# Patient Record
Sex: Male | Born: 1985 | Race: Black or African American | Hispanic: No | Marital: Single | Smoking: Former smoker
Health system: Southern US, Community
[De-identification: ages and names within clinical notes are randomized; demographics above are authoritative.]

## PROBLEM LIST (undated history)

## (undated) DIAGNOSIS — K219 Gastro-esophageal reflux disease without esophagitis: Secondary | ICD-10-CM

---

## 2005-05-31 ENCOUNTER — Emergency Department (HOSPITAL_COMMUNITY): Admission: EM | Admit: 2005-05-31 | Discharge: 2005-05-31 | Payer: Self-pay | Admitting: Emergency Medicine

## 2005-06-21 ENCOUNTER — Emergency Department (HOSPITAL_COMMUNITY): Admission: EM | Admit: 2005-06-21 | Discharge: 2005-06-21 | Payer: Self-pay | Admitting: Emergency Medicine

## 2006-06-02 ENCOUNTER — Emergency Department (HOSPITAL_COMMUNITY): Admission: EM | Admit: 2006-06-02 | Discharge: 2006-06-02 | Payer: Self-pay | Admitting: Emergency Medicine

## 2008-01-30 ENCOUNTER — Emergency Department (HOSPITAL_COMMUNITY): Admission: EM | Admit: 2008-01-30 | Discharge: 2008-01-30 | Payer: Self-pay | Admitting: Emergency Medicine

## 2008-03-01 ENCOUNTER — Emergency Department (HOSPITAL_COMMUNITY): Admission: EM | Admit: 2008-03-01 | Discharge: 2008-03-01 | Payer: Self-pay | Admitting: Emergency Medicine

## 2008-03-15 ENCOUNTER — Emergency Department (HOSPITAL_COMMUNITY): Admission: EM | Admit: 2008-03-15 | Discharge: 2008-03-15 | Payer: Self-pay | Admitting: Emergency Medicine

## 2008-07-14 ENCOUNTER — Emergency Department (HOSPITAL_COMMUNITY): Admission: EM | Admit: 2008-07-14 | Discharge: 2008-07-14 | Payer: Self-pay | Admitting: Emergency Medicine

## 2008-10-02 ENCOUNTER — Emergency Department (HOSPITAL_COMMUNITY): Admission: EM | Admit: 2008-10-02 | Discharge: 2008-10-02 | Payer: Self-pay | Admitting: Emergency Medicine

## 2010-11-01 LAB — DIFFERENTIAL
Basophils Absolute: 0 10*3/uL (ref 0.0–0.1)
Basophils Relative: 0 % (ref 0–1)
Eosinophils Relative: 2 % (ref 0–5)
Monocytes Absolute: 0.4 10*3/uL (ref 0.1–1.0)
Neutrophils Relative %: 55 % (ref 43–77)

## 2010-11-01 LAB — CBC
Hemoglobin: 14.2 g/dL (ref 13.0–17.0)
Platelets: 308 10*3/uL (ref 150–400)
RBC: 5.03 MIL/uL (ref 4.22–5.81)
RDW: 12.1 % (ref 11.5–15.5)

## 2010-11-01 LAB — COMPREHENSIVE METABOLIC PANEL
Albumin: 4.5 g/dL (ref 3.5–5.2)
Alkaline Phosphatase: 51 U/L (ref 39–117)
BUN: 9 mg/dL (ref 6–23)
Chloride: 101 mEq/L (ref 96–112)
Creatinine, Ser: 1.36 mg/dL (ref 0.4–1.5)
GFR calc non Af Amer: >60 mL/min (ref >60)
Sodium: 136 mEq/L (ref 135–145)
Total Bilirubin: 1.1 mg/dL (ref 0.3–1.2)
Total Protein: 7.6 g/dL (ref 6.0–8.3)

## 2010-11-01 LAB — ETHANOL: Alcohol, Ethyl (B): <5 mg/dL (ref 0–10)

## 2010-11-01 LAB — LIPASE, BLOOD: Lipase: 22 U/L (ref 11–59)

## 2011-04-19 LAB — GC/CHLAMYDIA PROBE AMP, GENITAL
Chlamydia, DNA Probe: NEGATIVE
GC Probe Amp, Genital: NEGATIVE

## 2011-09-01 ENCOUNTER — Encounter (HOSPITAL_COMMUNITY): Payer: Self-pay | Admitting: Adult Health

## 2011-09-01 ENCOUNTER — Emergency Department (HOSPITAL_COMMUNITY)
Admission: EM | Admit: 2011-09-01 | Discharge: 2011-09-01 | Disposition: A | Discharge status: Home / Self Care | Payer: Self-pay | Attending: Emergency Medicine | Admitting: Emergency Medicine

## 2011-09-01 DIAGNOSIS — N342 Other urethritis: Secondary | ICD-10-CM | POA: Insufficient documentation

## 2011-09-01 DIAGNOSIS — R369 Urethral discharge, unspecified: Secondary | ICD-10-CM | POA: Insufficient documentation

## 2011-09-01 DIAGNOSIS — R109 Unspecified abdominal pain: Secondary | ICD-10-CM | POA: Insufficient documentation

## 2011-09-01 DIAGNOSIS — R10819 Abdominal tenderness, unspecified site: Secondary | ICD-10-CM | POA: Insufficient documentation

## 2011-09-01 HISTORY — DX: Gastro-esophageal reflux disease without esophagitis: K21.9

## 2011-09-01 LAB — URINALYSIS, ROUTINE W REFLEX MICROSCOPIC
Bilirubin Urine: NEGATIVE
Glucose, UA: NEGATIVE mg/dL
Hgb urine dipstick: NEGATIVE
Specific Gravity, Urine: 1.015 (ref 1.005–1.030)

## 2011-09-01 LAB — RPR: RPR Ser Ql: NONREACTIVE

## 2011-09-01 MED ORDER — CEFTRIAXONE SODIUM 250 MG IJ SOLR
250.0000 mg | Freq: Once | INTRAMUSCULAR | Status: AC
  Administered 2011-09-01: 250 mg via INTRAMUSCULAR
  Filled 2011-09-01: qty 250

## 2011-09-01 MED ORDER — AZITHROMYCIN 250 MG PO TABS
1000.0000 mg | ORAL_TABLET | Freq: Once | ORAL | Status: AC
  Administered 2011-09-01: 1000 mg via ORAL
  Filled 2011-09-01: qty 4

## 2011-09-01 MED ORDER — LIDOCAINE HCL 2 % IJ SOLN
INTRAMUSCULAR | Status: AC
  Administered 2011-09-01: 11:00:00
  Filled 2011-09-01: qty 1

## 2011-09-01 NOTE — ED Provider Notes (Signed)
Medical screening examination/treatment/procedure(s) were performed by non-physician practitioner and as supervising physician I was immediately available for consultation/collaboration.  Doug Sou, MD 09/01/11 [phone]

## 2011-09-01 NOTE — ED Provider Notes (Signed)
History     CSN: [id number]  Arrival date & time 09/01/11  0918   First MD Initiated Contact with Patient 09/01/11 [phone]      Chief Complaint  Patient presents with  . Abdominal Pain    (Consider location/radiation/quality/duration/timing/severity/associated sxs/prior treatment) HPI Comments: Patient reports 2-3 days of LLQ pain that is aching and intermittent, with associated while penile discharge.  Denies fever, N/V, scrotal or testicular pain or tenderness, dysuria, urinary frequency or urgency, change in bowel habits.  Last BM was today and was normal.  Patient was recently released from jail and has since had sexual intercourse with a new partner.    Patient is a 26 y.o. male presenting with abdominal pain. The history is provided by the patient.  Abdominal Pain The primary symptoms of the illness include abdominal pain. The primary symptoms of the illness do not include fever, nausea, vomiting, diarrhea or dysuria.  Symptoms associated with the illness do not include constipation, urgency, hematuria or frequency.    Past Medical History  Diagnosis Date  . GERD (gastroesophageal reflux disease)     History reviewed. No pertinent past surgical history.  History reviewed. No pertinent family history.  History  Substance Use Topics  . Smoking status: Current Everyday Smoker  . Smokeless tobacco: Not on file  . Alcohol Use: Yes      Review of Systems  Constitutional: Negative for fever.  Gastrointestinal: Positive for abdominal pain. Negative for nausea, vomiting, diarrhea, constipation and rectal pain.  Genitourinary: Positive for discharge. Negative for dysuria, urgency, frequency, hematuria, penile swelling, scrotal swelling, penile pain and testicular pain.  All other systems reviewed and are negative.    Allergies  Review of patient's allergies indicates no known allergies.  Home Medications   Current Outpatient Rx  Name Route Sig Dispense Refill  . ADULT  MULTIVITAMIN W/MINERALS CH Oral Take 1 tablet by mouth daily.    Marland Kitchen PSEUDOEPH-DOXYLAMINE-DM-APAP 60-7.12-18-998 MG/30ML PO LIQD Oral Take 30 mLs by mouth every 6 (six) hours as needed. For symptom relief      BP 118/57  Pulse 55  Temp 98.4 F (36.9 C)  Resp 16  SpO2 100%  Physical Exam  Constitutional: He is oriented to person, place, and time. He appears well-developed and well-nourished.  HENT:  Head: Normocephalic and atraumatic.  Neck: Neck supple.  Cardiovascular: Normal rate, regular rhythm and normal heart sounds.   Pulmonary/Chest: Breath sounds normal. No respiratory distress. He has no wheezes. He has no rales. He exhibits no tenderness.  Abdominal: Soft. Bowel sounds are normal. He exhibits no distension and no mass. There is tenderness in the suprapubic area. There is no rebound and no guarding.    Genitourinary: Testes normal. Right testis shows no mass, no swelling and no tenderness. Right testis is descended. Left testis shows no mass, no swelling and no tenderness. Left testis is descended. Circumcised. Discharge found.       White urethral discharge.  No lesions noted.   Neurological: He is alert and oriented to person, place, and time.    ED Course  Procedures (including critical care time)  Labs Reviewed  URINALYSIS, ROUTINE W REFLEX MICROSCOPIC - Abnormal; Notable for the following:    Leukocytes, UA MODERATE (*)    All other components within normal limits  URINE MICROSCOPIC-ADD ON  GC/CHLAMYDIA PROBE AMP, GENITAL  RPR   No results found.   1. Urethritis       MDM  Well-appearing, afebrile patient with left suprapubic tenderness  and white penile discharge. Patient is not having fever, vomiting, dysuria, urinary frequency or urgency. Patient has a new sexual partner. This is likely consistent with sexual transmitted infection. GC chlamydia and RPR sent to the lab. Patient gets his normal STD and HIV testing at the health Department and last checked in  2012. Patient treated for GC chlamydia in the emergency department and discharged home with health department followup.  Patient verbalizes understanding and agrees with plan. Rise Patience, [place] 09/01/11 1441

## 2011-09-01 NOTE — ED Notes (Signed)
Presents with abdominal for 3 days. Pain is located in lower left quad associated with nausea ans vomitting, denies diarrhea. C/o of painful discharge from penis.

## 2011-09-03 LAB — GC/CHLAMYDIA PROBE AMP, GENITAL
Chlamydia, DNA Probe: NEGATIVE
GC Probe Amp, Genital: POSITIVE — AB

## 2011-09-04 NOTE — ED Notes (Signed)
Treated per protocol MD; Sensitive to same 

## 2011-09-04 NOTE — ED Notes (Signed)
Pt notified of lab result and informed to not have sex for atleast two weeks

## 2012-01-18 ENCOUNTER — Encounter (HOSPITAL_COMMUNITY): Payer: Self-pay | Admitting: *Deleted

## 2012-01-18 ENCOUNTER — Emergency Department (HOSPITAL_COMMUNITY)
Admission: EM | Admit: 2012-01-18 | Discharge: 2012-01-18 | Disposition: A | Discharge status: Home / Self Care | Payer: Self-pay | Attending: Emergency Medicine | Admitting: Emergency Medicine

## 2012-01-18 DIAGNOSIS — R109 Unspecified abdominal pain: Secondary | ICD-10-CM | POA: Insufficient documentation

## 2012-01-18 DIAGNOSIS — F17200 Nicotine dependence, unspecified, uncomplicated: Secondary | ICD-10-CM | POA: Insufficient documentation

## 2012-01-18 DIAGNOSIS — K219 Gastro-esophageal reflux disease without esophagitis: Secondary | ICD-10-CM | POA: Insufficient documentation

## 2012-01-18 DIAGNOSIS — Z202 Contact with and (suspected) exposure to infections with a predominantly sexual mode of transmission: Secondary | ICD-10-CM | POA: Insufficient documentation

## 2012-01-18 NOTE — Discharge Instructions (Signed)
Sexually Transmitted Disease Sexually transmitted disease (STD) refers to any infection that is passed from person to person during sexual activity. This may happen by way of saliva, semen, blood, vaginal mucus, or urine. Common STDs include:  Gonorrhea.   Chlamydia.   Syphilis.   HIV/AIDS.   Genital herpes.   Hepatitis B and C.   Trichomonas.   Human papillomavirus (HPV).   Pubic lice.  CAUSES  An STD may be spread by bacteria, virus, or parasite. A person can get an STD by:  Sexual intercourse with an infected person.   Sharing sex toys with an infected person.   Sharing needles with an infected person.   Having intimate contact with the genitals, mouth, or rectal areas of an infected person.  SYMPTOMS  Some people may not have any symptoms, but they can still pass the infection to others. Different STDs have different symptoms. Symptoms include:  Painful or bloody urination.   Pain in the pelvis, abdomen, vagina, anus, throat, or eyes.   Skin rash, itching, irritation, growths, or sores (lesions). These usually occur in the genital or anal area.   Abnormal vaginal discharge.   Penile discharge in men.   Soft, flesh-colored skin growths in the genital or anal area.   Fever.   Pain or bleeding during sexual intercourse.   Swollen glands in the groin area.   Yellow skin and eyes (jaundice). This is seen with hepatitis.  DIAGNOSIS  To make a diagnosis, your caregiver may:  Take a medical history.   Perform a physical exam.   Take a specimen (culture) to be examined.   Examine a sample of discharge under a microscope.   Perform blood tests.   Perform a Pap test, if this applies.   Perform a colposcopy.   Perform a laparoscopy.  TREATMENT   Chlamydia, gonorrhea, trichomonas, and syphilis can be cured with antibiotic medicine.   Genital herpes, hepatitis, and HIV can be treated, but not cured, with prescribed medicines. The medicines will lessen  the symptoms.   Genital warts from HPV can be treated with medicine or by freezing, burning (electrocautery), or surgery. Warts may come back.   HPV is a virus and cannot be cured with medicine or surgery.However, abnormal areas may be followed very closely by your caregiver and may be removed from the cervix, vagina, or vulva through office procedures or surgery.  If your diagnosis is confirmed, your recent sexual partners need treatment. This is true even if they are symptom-free or have a negative culture or evaluation. They should not have sex until their caregiver says it is okay. HOME CARE INSTRUCTIONS  All sexual partners should be informed, tested, and treated for all STDs.   Take your antibiotics as directed. Finish them even if you start to feel better.   Only take over-the-counter or prescription medicines for pain, discomfort, or fever as directed by your caregiver.   Rest.   Eat a balanced diet and drink enough fluids to keep your urine clear or pale yellow.   Do not have sex until treatment is completed and you have followed up with your caregiver. STDs should be checked after treatment.   Keep all follow-up appointments, Pap tests, and blood tests as directed by your caregiver.   Only use latex condoms and water-soluble lubricants during sexual activity. Do not use petroleum jelly or oils.   Avoid alcohol and illegal drugs.   Get vaccinated for HPV and hepatitis. If you have not received these vaccines   in the past, talk to your caregiver about whether one or both might be right for you.   Avoid risky sex practices that can break the skin.  The only way to avoid getting an STD is to avoid all sexual activity.Latex condoms and dental dams (for oral sex) will help lessen the risk of getting an STD, but will not completely eliminate the risk. SEEK MEDICAL CARE IF:   You have a fever.   You have any new or worsening symptoms.  Document Released: 09/28/2002 Document  Revised: 06/27/2011 Document Reviewed: 10/05/2010 Exit[employer] Patient Information 2012 [employer]. 

## 2012-01-18 NOTE — ED Notes (Signed)
Pt came out of room sts he needs to go home stat and that he needs to be at home by 1700, pt was up for discharge but was in a big hurry that he didn't want to spend the time to sign the E-Signature, but did grab the Discharge instruction. Pt didn't even wait for blood drawn for his STD testing. Informed that the lab will call him for positive gonorrhea and chlamydia

## 2012-01-18 NOTE — ED Notes (Signed)
Pt from home with reports of intermittent, lower abdominal pain as well as itching in the pubic hair area but denies penial pain, itching or discharge. Pt also denies N/V/D, recent surgery or injury to abdominal area.

## 2012-01-18 NOTE — ED Provider Notes (Signed)
History     CSN: [id number]  Arrival date & time 01/18/12  1442   First MD Initiated Contact with Patient 01/18/12 1559      Chief Complaint  Patient presents with  . Abdominal Pain  . Pruritis    pubic area    HPI The patient presents to the emergency room with concerns about getting checked for an STD. Patient states he is low but embarrassed when he was in triage. He's not really having any abdominal cramping. He also has not really noticed any itching. Patient states he has had 2 sexual partners recently. He got a call from one of him today telling him that she thinks she has an STD. She has been having vaginal discharge but has not seen anyone yet. He denies any dysuria or penile discharge. He states he's not really having any symptoms but just want to get checked out. Past Medical History  Diagnosis Date  . GERD (gastroesophageal reflux disease)     History reviewed. No pertinent past surgical history.  History reviewed. No pertinent family history.  History  Substance Use Topics  . Smoking status: Current Everyday Smoker -- 0.5 packs/day    Types: Cigarettes  . Smokeless tobacco: Never Used  . Alcohol Use: Yes     occ      Review of Systems  Constitutional: Negative for fever.  Gastrointestinal: Negative for abdominal pain and abdominal distention.  Genitourinary: Negative for dysuria.    Allergies  Review of patient's allergies indicates no known allergies.  Home Medications   Current Outpatient Rx  Name Route Sig Dispense Refill  . ADULT MULTIVITAMIN W/MINERALS CH Oral Take 1 tablet by mouth daily.      BP 117/67  Pulse 66  Temp 99.2 F (37.3 C) (Oral)  Resp 16  Ht 5\' 8"  (1.727 m)  Wt 180 lb (81.647 kg)  BMI 27.37 kg/m2  SpO2 100%  Physical Exam  Nursing note and vitals reviewed. Constitutional: He appears well-developed and well-nourished. No distress.  HENT:  Head: Normocephalic and atraumatic.  Right Ear: External ear normal.  Left Ear:  External ear normal.  Eyes: Conjunctivae are normal. Right eye exhibits no discharge. Left eye exhibits no discharge. No scleral icterus.  Neck: Neck supple. No tracheal deviation present.  Cardiovascular: Normal rate, regular rhythm and intact distal pulses.   Pulmonary/Chest: Effort normal and breath sounds normal. No stridor. No respiratory distress. He has no wheezes. He has no rales.  Abdominal: Soft. Bowel sounds are normal. He exhibits no distension. There is no tenderness. There is no rebound and no guarding.  Genitourinary: Penis normal.  Musculoskeletal: He exhibits no edema and no tenderness.  Neurological: He is alert. He has normal strength. No sensory deficit. Cranial nerve deficit:  no gross defecits noted. He exhibits normal muscle tone. He displays no seizure activity. Coordination normal.  Skin: Skin is warm and dry. No rash noted.  Psychiatric: He has a normal mood and affect.    ED Course  Procedures (including critical care time)   Labs Reviewed  GC/CHLAMYDIA PROBE AMP, GENITAL  RPR  HIV ANTIBODY (ROUTINE TESTING)   No results found.   No diagnosis found.    MDM  STD exposure panels been ordered per the patient's request. I offered HIV screening as well and he did request that that be done in addition to the [facility] Chlamydia probe and the RPR test.        Celene Kras, MD 01/18/12 [phone]

## 2012-01-20 LAB — GC/CHLAMYDIA PROBE AMP, GENITAL
Chlamydia, DNA Probe: NEGATIVE
GC Probe Amp, Genital: NEGATIVE

## 2013-04-18 ENCOUNTER — Emergency Department (HOSPITAL_COMMUNITY)
Admission: EM | Admit: 2013-04-18 | Discharge: 2013-04-18 | Disposition: A | Discharge status: Home / Self Care | Payer: Self-pay | Attending: Emergency Medicine | Admitting: Emergency Medicine

## 2013-04-18 ENCOUNTER — Emergency Department: Payer: Self-pay | Admitting: Internal Medicine

## 2013-04-18 ENCOUNTER — Encounter (HOSPITAL_COMMUNITY): Payer: Self-pay | Admitting: *Deleted

## 2013-04-18 DIAGNOSIS — Z8719 Personal history of other diseases of the digestive system: Secondary | ICD-10-CM | POA: Insufficient documentation

## 2013-04-18 DIAGNOSIS — B356 Tinea cruris: Secondary | ICD-10-CM | POA: Insufficient documentation

## 2013-04-18 DIAGNOSIS — F17200 Nicotine dependence, unspecified, uncomplicated: Secondary | ICD-10-CM | POA: Insufficient documentation

## 2013-04-18 MED ORDER — TERBINAFINE HCL 1 % EX CREA: TOPICAL_CREAM | Freq: Two times a day (BID) | CUTANEOUS | Status: DC

## 2013-04-18 NOTE — ED Provider Notes (Signed)
Medical screening examination/treatment/procedure(s) were performed by non-physician practitioner and as supervising physician I was immediately available for consultation/collaboration.   Dagmar Hait, MD 04/18/13 [phone]

## 2013-04-18 NOTE — ED Provider Notes (Signed)
CSN: [id number]     Arrival date & time 04/18/13  1509 History  This chart was scribed for non-physician practitioner working with Dagmar Hait, MD by Danella Maiers, ED Scribe. This patient was seen in room TR08C/TR08C and the patient's care was started at 4:05 PM.   Chief Complaint  Patient presents with  . Rash   Patient is a 27 y.o. male presenting with rash. The history is provided by the patient. No language interpreter was used.  Rash  HPI Comments: Kyle Lang is a 27 y.o. male who presents to the Emergency Department complaining of constant itchy rash to the suprapubic region in pubic hair area onset one week ago. He denies rash to shaft or penile discharge. He works out frequently. He denies having any pain or noticing any lesions to the area. He has not had any systemic symptoms of fevers, nausea, vomiting, diarrhea, weakness. He denies urinary problems, penile discharge. He denies any pain to the area.  Past Medical History  Diagnosis Date  . GERD (gastroesophageal reflux disease)    History reviewed. No pertinent past surgical history. History reviewed. No pertinent family history. History  Substance Use Topics  . Smoking status: Current Every Day Smoker -- 0.50 packs/day    Types: Cigarettes  . Smokeless tobacco: Never Used  . Alcohol Use: Yes     Comment: occ    Review of Systems  Genitourinary: Negative for dysuria, discharge and difficulty urinating.  Skin: Positive for rash.  All other systems reviewed and are negative.    Allergies  Review of patient's allergies indicates no known allergies.  Home Medications   Current Outpatient Rx  Name  Route  Sig  Dispense  Refill  . terbinafine (LAMISIL AT JOCK ITCH) 1 % cream   Topical   Apply topically 2 (two) times daily. For 3- 4 weeks   30 g   0    BP 126/69  Pulse 64  Temp(Src) 99 F (37.2 C) (Oral)  Resp 98  SpO2 95% Physical Exam  Nursing note and vitals reviewed. Constitutional: He  is oriented to person, place, and time. He appears well-developed and well-nourished. No distress.  HENT:  Head: Normocephalic and atraumatic.  Eyes: EOM are normal.  Neck: Neck supple. No tracheal deviation present.  Cardiovascular: Normal rate.   Pulmonary/Chest: Effort normal. No respiratory distress.  Genitourinary: Penis normal.     Musculoskeletal: Normal range of motion.  Neurological: He is alert and oriented to person, place, and time.  Skin: Skin is warm and dry.  Psychiatric: He has a normal mood and affect. His behavior is normal.    ED Course  Procedures (including critical care time) Medications - No data to display  DIAGNOSTIC STUDIES: Oxygen Saturation is 95% on RA, normal by my interpretation.    COORDINATION OF CARE: 4:39 PM- Discussed treatment plan with pt which includes trimming around the area and applying fungal cream to the area 2x per day for up to ten days. Pt agrees to plan.    Labs Review Labs Reviewed - No data to display Imaging Review No results found.  MDM   1. Jock itch     Patients symptoms consistent with tinea cruris. Lamisil recommended BID for 3-4 weeks. Advised to use it for 10 days at least and return to the ER if symptoms have not started improving. Told to return sooner if symptoms are getting worse.  27 y.o.Kyle Lang's evaluation in the Emergency Department is complete. It has been  determined that no acute conditions requiring further emergency intervention are present at this time. The patient/guardian have been advised of the diagnosis and plan. We have discussed signs and symptoms that warrant return to the ED, such as changes or worsening in symptoms.  Vital signs are stable at discharge. Filed Vitals:   04/18/13 1512  BP: 126/69  Pulse: 64  Temp: 99 F (37.2 C)  Resp: 98    Patient/guardian has voiced understanding and agreed to follow-up with the PCP or specialist.  I personally performed the services  described in this documentation, which was scribed in my presence. The recorded information has been reviewed and is accurate.    Dorthula Matas, PA-C 04/18/13 1650

## 2013-04-18 NOTE — ED Notes (Signed)
Pt reports rash and skin irritation to pelvic area. Denies any urinary problems or penile discharge. No acute distress noted at triage.

## 2013-04-18 NOTE — ED Notes (Signed)
Pt presents with a bump in perineal region above penis. States that it appeared two days ago and now the area itches. Denies recent sexual activity. Denies penile discharge, odor, or pain.

## 2013-09-02 ENCOUNTER — Emergency Department: Payer: Self-pay | Admitting: Internal Medicine

## 2013-09-02 LAB — URINALYSIS, COMPLETE
BACTERIA: NONE SEEN
BLOOD: NEGATIVE
Bilirubin,UR: NEGATIVE
GLUCOSE, UR: NEGATIVE mg/dL (ref 0–75)
KETONE: NEGATIVE
Leukocyte Esterase: NEGATIVE
Nitrite: NEGATIVE
Ph: 7 (ref 4.5–8.0)
Protein: NEGATIVE
RBC,UR: 1 /HPF (ref 0–5)
Specific Gravity: 1.016 (ref 1.003–1.030)

## 2013-09-02 LAB — GC/CHLAMYDIA PROBE AMP

## 2014-05-29 ENCOUNTER — Encounter (HOSPITAL_COMMUNITY): Payer: Self-pay | Admitting: *Deleted

## 2014-05-29 ENCOUNTER — Emergency Department (HOSPITAL_COMMUNITY): Payer: No Typology Code available for payment source

## 2014-05-29 ENCOUNTER — Emergency Department (HOSPITAL_COMMUNITY)
Admission: EM | Admit: 2014-05-29 | Discharge: 2014-05-29 | Disposition: A | Discharge status: Home / Self Care | Payer: No Typology Code available for payment source | Attending: Emergency Medicine | Admitting: Emergency Medicine

## 2014-05-29 DIAGNOSIS — Z79899 Other long term (current) drug therapy: Secondary | ICD-10-CM | POA: Insufficient documentation

## 2014-05-29 DIAGNOSIS — S40012A Contusion of left shoulder, initial encounter: Secondary | ICD-10-CM | POA: Insufficient documentation

## 2014-05-29 DIAGNOSIS — Y998 Other external cause status: Secondary | ICD-10-CM | POA: Insufficient documentation

## 2014-05-29 DIAGNOSIS — Y92410 Unspecified street and highway as the place of occurrence of the external cause: Secondary | ICD-10-CM | POA: Insufficient documentation

## 2014-05-29 DIAGNOSIS — S161XXA Strain of muscle, fascia and tendon at neck level, initial encounter: Secondary | ICD-10-CM | POA: Insufficient documentation

## 2014-05-29 DIAGNOSIS — Y9389 Activity, other specified: Secondary | ICD-10-CM | POA: Insufficient documentation

## 2014-05-29 DIAGNOSIS — Z720 Tobacco use: Secondary | ICD-10-CM | POA: Insufficient documentation

## 2014-05-29 DIAGNOSIS — Z8719 Personal history of other diseases of the digestive system: Secondary | ICD-10-CM | POA: Insufficient documentation

## 2014-05-29 MED ORDER — IBUPROFEN 200 MG PO TABS
600.0000 mg | ORAL_TABLET | Freq: Once | ORAL | Status: AC
  Administered 2014-05-29: 600 mg via ORAL
  Filled 2014-05-29: qty 3
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
## 2014-05-29 NOTE — Discharge Instructions (Signed)
Cervical Sprain A cervical sprain is when the tissues (ligaments) that hold the neck bones in place stretch or tear. HOME CARE   Put ice on the injured area.  Put ice in a plastic bag.  Place a towel between your skin and the bag.  Leave the ice on for 15-20 minutes, 3-4 times a day.  You may have been given a collar to wear. This collar keeps your neck from moving while you heal.  Do not take the collar off unless told by your doctor.  If you have long hair, keep it outside of the collar.  Ask your doctor before changing the position of your collar. You may need to change its position over time to make it more comfortable.  If you are allowed to take off the collar for cleaning or bathing, follow your doctor's instructions on how to do it safely.  Keep your collar clean by wiping it with mild soap and water. Dry it completely. If the collar has removable pads, remove them every 1-2 days to hand wash them with soap and water. Allow them to air dry. They should be dry before you wear them in the collar.  Do not drive while wearing the collar.  Only take medicine as told by your doctor.  Keep all doctor visits as told.  Keep all physical therapy visits as told.  Adjust your work station so that you have good posture while you work.  Avoid positions and activities that make your problems worse.  Warm up and stretch before being active. GET HELP IF:  Your pain is not controlled with medicine.  You cannot take less pain medicine over time as planned.  Your activity level does not improve as expected. GET HELP RIGHT AWAY IF:   You are bleeding.  Your stomach is upset.  You have an allergic reaction to your medicine.  You develop new problems that you cannot explain.  You lose feeling (become numb) or you cannot move any part of your body (paralysis).  You have tingling or weakness in any part of your body.  Your symptoms get worse. Symptoms include:  Pain,  soreness, stiffness, puffiness (swelling), or a burning feeling in your neck.  Pain when your neck is touched.  Shoulder or upper back pain.  Limited ability to move your neck.  Headache.  Dizziness.  Your hands or arms feel week, lose feeling, or tingle.  Muscle spasms.  Difficulty swallowing or chewing. MAKE SURE YOU:   Understand these instructions.  Will watch your condition.  Will get help right away if you are not doing well or get worse. Document Released: 12/25/2007 Document Revised: 03/10/2013 Document Reviewed: 01/13/2013 Exit[employer] Patient Information 2015 [employer]. This information is not intended to replace advice given to you by your health care provider. Make sure you discuss any questions you have with your health care provider.  

## 2018-03-02 ENCOUNTER — Emergency Department (HOSPITAL_COMMUNITY)
Admission: EM | Admit: 2018-03-02 | Discharge: 2018-03-02 | Disposition: A | Discharge status: Home / Self Care | Payer: Self-pay | Attending: Emergency Medicine | Admitting: Emergency Medicine

## 2018-03-02 ENCOUNTER — Other Ambulatory Visit: Payer: Self-pay

## 2018-03-02 ENCOUNTER — Encounter (HOSPITAL_COMMUNITY): Payer: Self-pay

## 2018-03-02 DIAGNOSIS — F17210 Nicotine dependence, cigarettes, uncomplicated: Secondary | ICD-10-CM | POA: Insufficient documentation

## 2018-03-02 DIAGNOSIS — M5432 Sciatica, left side: Secondary | ICD-10-CM | POA: Insufficient documentation

## 2018-03-02 MED ORDER — PREDNISONE 10 MG (21) PO TBPK: ORAL_TABLET | Freq: Every day | ORAL | 0 refills | Status: DC

## 2018-03-02 MED ORDER — METHOCARBAMOL 500 MG PO TABS: 500.0000 mg | ORAL_TABLET | Freq: Every evening | ORAL | 0 refills | Status: DC | PRN

## 2018-03-02 NOTE — Discharge Instructions (Addendum)
Take prednisone as prescribed. Do not take anti-inflammatories at the same time (Advil, naproxen, Motrin, ibuprofen, Aleve). You may supplement with Tylenol if you need further pain control. Use Robaxin as needed for muscle stiffness or soreness. Have caution, as this may make you tired or groggy. Do not drive or operate heavy machinery while taking this medication.  Use muscle creams (bengay, icy hot, salonpas) as needed for pain.  Follow up with a primary care doctor if pain is not improving with this treatment in 2 weeks.  Return to the ER if you develop high fevers, numbness, loss of bowel or bladder control, or any new or concerning symptoms.

## 2018-03-02 NOTE — ED Triage Notes (Signed)
Pt reports lower back pain with some radiation into his leg. He states pain intermittently X2 months but worse today.

## 2018-03-02 NOTE — ED Provider Notes (Signed)
[facility]   CSN: [id number] Arrival date & time: 03/02/18  1410     History   Chief Complaint Chief Complaint  Patient presents with  . Back Pain    HPI Kyle Lang is a 32 y.o. male presenting for evaluation of back pain.   Pt states 2 weeks ago he was bending forward to lift something heavy, when he had acute onset of low back pain.  Pain was mild, but not improving over the past several weeks.  Past 2 days, pain is been worsening.  He reports pain starts in the middle of his back, and radiates down the back of his left leg.  He denies fevers, chills, rash, loss of bowel or bladder control, numbness, tingling, history of cancer, or history of IV drug use.  He denies history of back pain.  He has no other medical problems, takes no medications daily.  He has been taking 1-2 ibuprofen tablets every once in a while without significant improvement of his symptoms.  He denies taking anything else for pain.  He denies symptoms on the right leg.  He denies urinary symptoms.  HPI  Past Medical History:  Diagnosis Date  . GERD (gastroesophageal reflux disease)     There are no active problems to display for this patient.   History reviewed. No pertinent surgical history.      Home Medications    Prior to Admission medications   Medication Sig Start Date End Date Taking? Authorizing Provider  methocarbamol (ROBAXIN) 500 MG tablet Take 1 tablet (500 mg total) by mouth at bedtime as needed for muscle spasms. 03/02/18   Paisli Silfies, PA-C  predniSONE (STERAPRED UNI-PAK 21 TAB) 10 MG (21) TBPK tablet Take by mouth daily. Take 6 tabs by mouth daily  for 2 days, then 5 tabs for 2 days, then 4 tabs for 2 days, then 3 tabs for 2 days, 2 tabs for 2 days, then 1 tab by mouth daily for 2 days 03/02/18   Nicolo Tomko, PA-C    Family History History reviewed. No pertinent family history.  Social History Social History    Tobacco Use  . Smoking status: Current Every Day Smoker    Packs/day: 0.50    Types: Cigarettes  . Smokeless tobacco: Never Used  Substance Use Topics  . Alcohol use: Yes    Comment: occ  . Drug use: No     Allergies   Patient has no known allergies.   Review of Systems Review of Systems  Musculoskeletal: Positive for back pain.  All other systems reviewed and are negative.    Physical Exam Updated Vital Signs BP 127/72 (BP Location: Right Arm)   Pulse 67   Temp 98.7 F (37.1 C) (Oral)   Resp 18   SpO2 97%   Physical Exam  Constitutional: He is oriented to person, place, and time. He appears well-developed and well-nourished. No distress.  HENT:  Head: Normocephalic and atraumatic.  Eyes: EOM are normal.  Neck: Normal range of motion.  Cardiovascular: Normal rate, regular rhythm and intact distal pulses.  Pulmonary/Chest: Effort normal and breath sounds normal. No respiratory distress. He has no wheezes.  Abdominal: He exhibits no distension.  Musculoskeletal: Normal range of motion.  Tenderness palpation of left low back musculature and left buttock.  Pedal pulses intact bilaterally.  Patient is ambulatory.  Increased radiation of the pain with straight leg raise.  No tenderness palpation of the right side. Midline spine  without step-offs, but crepitus or deformities.  Neurological: He is alert and oriented to person, place, and time.  Skin: Skin is warm. No rash noted.  Psychiatric: He has a normal mood and affect.  Nursing note and vitals reviewed.    ED Treatments / Results  Labs (all labs ordered are listed, but only abnormal results are displayed) Labs Reviewed - No data to display  EKG None  Radiology No results found.  Procedures Procedures (including critical care time)  Medications Ordered in ED Medications - No data to display   Initial Impression / Assessment and Plan / ED Course  I have reviewed the triage vital signs and the  nursing notes.  Pertinent labs & imaging results that were available during my care of the patient were reviewed by me and considered in my medical decision making (see chart for details).     Patient presenting for evaluation of low back pain.  Physical exam reassuring, neurovascularly intact.  No red flags for back pain.  Pain is reproducible with palpation of the musculature, and + SLR. Likely sciatica.  Doubt fracture, I do not believe x-rays will be beneficial.  Doubt vertebral injury, infection, spinal cord compression, myelopathy, or cauda equina syndrome.  Will treat symptomatically with prednisone pack, muscle relaxers, muscle creams.  Patient given information to follow-up with primary care as needed. At this time, patient appears safe for discharge.  Return precautions given.  Patient states he understands agrees plan.   Final Clinical Impressions(s) / ED Diagnoses   Final diagnoses:  Sciatica of left side    ED Discharge Orders         Ordered    predniSONE (STERAPRED UNI-PAK 21 TAB) 10 MG (21) TBPK tablet  Daily     03/02/18 1751    methocarbamol (ROBAXIN) 500 MG tablet  At bedtime PRN     03/02/18 1751           Janeka Libman, PA-C 03/02/18 1804    Loren RacerYelverton, David, MD 03/02/18 1810

## 2018-03-02 NOTE — ED Notes (Signed)
Pt given d/c instructions and several methods for stretching and proper lifting techniques. Pt verbalized understanding. Pt ambulate to lobby with friend. No questions or concerns voiced upon d/c.

## 2018-03-14 ENCOUNTER — Emergency Department (HOSPITAL_COMMUNITY): Payer: No Typology Code available for payment source

## 2018-03-14 ENCOUNTER — Encounter (HOSPITAL_COMMUNITY): Payer: Self-pay | Admitting: Emergency Medicine

## 2018-03-14 ENCOUNTER — Emergency Department (HOSPITAL_COMMUNITY)
Admission: EM | Admit: 2018-03-14 | Discharge: 2018-03-14 | Disposition: A | Discharge status: Home / Self Care | Payer: No Typology Code available for payment source | Attending: Emergency Medicine | Admitting: Emergency Medicine

## 2018-03-14 DIAGNOSIS — Y929 Unspecified place or not applicable: Secondary | ICD-10-CM | POA: Diagnosis not present

## 2018-03-14 DIAGNOSIS — S39012A Strain of muscle, fascia and tendon of lower back, initial encounter: Secondary | ICD-10-CM | POA: Insufficient documentation

## 2018-03-14 DIAGNOSIS — Y939 Activity, unspecified: Secondary | ICD-10-CM | POA: Diagnosis not present

## 2018-03-14 DIAGNOSIS — Y999 Unspecified external cause status: Secondary | ICD-10-CM | POA: Diagnosis not present

## 2018-03-14 DIAGNOSIS — S161XXA Strain of muscle, fascia and tendon at neck level, initial encounter: Secondary | ICD-10-CM | POA: Diagnosis not present

## 2018-03-14 DIAGNOSIS — S199XXA Unspecified injury of neck, initial encounter: Secondary | ICD-10-CM | POA: Diagnosis present

## 2018-03-14 DIAGNOSIS — Z87891 Personal history of nicotine dependence: Secondary | ICD-10-CM | POA: Insufficient documentation

## 2018-03-14 DIAGNOSIS — R51 Headache: Secondary | ICD-10-CM | POA: Diagnosis not present

## 2018-03-14 MED ORDER — IBUPROFEN 400 MG PO TABS: 600.0000 mg | ORAL_TABLET | Freq: Once | ORAL | Status: DC

## 2018-03-14 NOTE — ED Notes (Signed)
Declined W/C at D/C and was escorted to lobby by RN. 
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
## 2018-03-14 NOTE — Discharge Instructions (Signed)
Take NSAIDs or Tylenol as needed for the next week. Take this medicine with food. °Take muscle relaxer at bedtime to help you sleep. This medicine makes you drowsy so do not take before driving or work °Use a heating pad for sore muscles - use for 20 minutes several times a day °Return for worsening symptoms ° °
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
## 2020-05-12 ENCOUNTER — Other Ambulatory Visit: Payer: Self-pay

## 2020-05-12 ENCOUNTER — Encounter (HOSPITAL_COMMUNITY): Payer: Self-pay

## 2020-05-12 ENCOUNTER — Emergency Department (HOSPITAL_COMMUNITY)
Admission: EM | Admit: 2020-05-12 | Discharge: 2020-05-12 | Disposition: A | Discharge status: Home / Self Care | Payer: Self-pay | Attending: Emergency Medicine | Admitting: Emergency Medicine

## 2020-05-12 DIAGNOSIS — Z5321 Procedure and treatment not carried out due to patient leaving prior to being seen by health care provider: Secondary | ICD-10-CM | POA: Diagnosis not present

## 2020-05-12 DIAGNOSIS — R1084 Generalized abdominal pain: Secondary | ICD-10-CM | POA: Insufficient documentation
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
## 2020-05-12 NOTE — ED Notes (Signed)
Patient states he is going home, patient encouraged to stay to be seen but declined.

## 2020-05-12 NOTE — ED Notes (Signed)
Patient speaking on the phone in no acute distress.

## 2020-05-12 NOTE — ED Notes (Signed)
Patient ambulated to the restroom with no difficulty   ° °

## 2021-01-28 ENCOUNTER — Emergency Department (HOSPITAL_COMMUNITY)
Admission: EM | Admit: 2021-01-28 | Discharge: 2021-01-28 | Disposition: A | Discharge status: Home / Self Care | Payer: Self-pay | Attending: Emergency Medicine | Admitting: Emergency Medicine

## 2021-01-28 ENCOUNTER — Encounter (HOSPITAL_COMMUNITY): Payer: Self-pay | Admitting: Emergency Medicine

## 2021-01-28 ENCOUNTER — Other Ambulatory Visit: Payer: Self-pay

## 2021-01-28 DIAGNOSIS — H1033 Unspecified acute conjunctivitis, bilateral: Secondary | ICD-10-CM | POA: Insufficient documentation

## 2021-01-28 DIAGNOSIS — Z87891 Personal history of nicotine dependence: Secondary | ICD-10-CM | POA: Insufficient documentation

## 2021-01-28 MED ORDER — CIPROFLOXACIN HCL 0.3 % OP SOLN: 1.0000 [drp] | OPHTHALMIC | 0 refills | Status: AC

## 2021-01-28 MED ORDER — IBUPROFEN 200 MG PO TABS
200.0000 mg | ORAL_TABLET | Freq: Once | ORAL | Status: AC
  Administered 2021-01-28: 200 mg via ORAL
  Filled 2021-01-28: qty 1

## 2021-01-28 NOTE — ED Triage Notes (Signed)
Pt came in with c/o conjuctivitis, both eyes. Started this morning

## 2021-01-28 NOTE — ED Provider Notes (Signed)
[facility]-EMERGENCY DEPT Provider Note   CSN: [id number] Arrival date & time: 01/28/21  0415     History Chief Complaint  Patient presents with   Conjunctivitis    Kyle Lang is a 35 y.o. male.  The history is provided by the patient.  Conjunctivitis This is a new problem. The current episode started 3 to 5 hours ago. The problem occurs constantly. The problem has not changed since onset.Pertinent negatives include no chest pain, no abdominal pain, no headaches and no shortness of breath. Nothing aggravates the symptoms. Nothing relieves the symptoms. He has tried nothing for the symptoms. The treatment provided no relief.      Past Medical History:  Diagnosis Date   GERD (gastroesophageal reflux disease)     There are no problems to display for this patient.   History reviewed. No pertinent surgical history.     History reviewed. No pertinent family history.  Social History   Tobacco Use   Smoking status: Former    Packs/day: 0.50    Pack years: 0.00    Types: Cigarettes   Smokeless tobacco: Never  Substance Use Topics   Alcohol use: Yes    Comment: occ   Drug use: No    Home Medications Prior to Admission medications   Medication Sig Start Date End Date Taking? Authorizing Provider  ciprofloxacin (CILOXAN) 0.3 % ophthalmic solution Place 1 drop into both eyes every 4 (four) hours while awake. Administer 1 drop, every 2 hours, while awake, for 2 days. Then 1 drop, every 4 hours, while awake, for the next 5 days. 01/28/21  Yes Gregrey Bloyd, MD  OVER THE COUNTER MEDICATION Place 4-5 drops into both eyes every 2 (two) hours as needed.   Yes [provider]    Allergies    Patient has no known allergies.  Review of Systems   Review of Systems  Constitutional:  Negative for fever.  HENT:  Negative for drooling.   Eyes:  Positive for discharge, redness and itching. Negative for visual disturbance.  Respiratory:  Negative  for shortness of breath and wheezing.   Cardiovascular:  Negative for chest pain.  Gastrointestinal:  Negative for abdominal pain.  Genitourinary:  Negative for difficulty urinating.  Musculoskeletal:  Negative for neck stiffness.  Skin:  Negative for wound.  Neurological:  Negative for headaches.  Psychiatric/Behavioral:  Negative for agitation.   All other systems reviewed and are negative.  Physical Exam Updated Vital Signs BP (!) 110/96 (BP Location: Left Arm)   Pulse 66   Temp (!) 97.5 F (36.4 C) (Oral)   Resp 16   Ht 5\' 8"  (1.727 m)   Wt 83.9 kg   SpO2 98%   BMI 28.13 kg/m   Physical Exam Vitals and nursing note reviewed.  Constitutional:      Appearance: Normal appearance.  HENT:     Head: Normocephalic and atraumatic.     Nose: Nose normal.  Eyes:     Extraocular Movements: Extraocular movements intact.     Conjunctiva/sclera:     Right eye: Right conjunctiva is injected.     Left eye: Left conjunctiva is injected.     Pupils: Pupils are equal, round, and reactive to light.     Comments: Discharge   Cardiovascular:     Rate and Rhythm: Normal rate and regular rhythm.     Pulses: Normal pulses.     Heart sounds: Normal heart sounds.  Pulmonary:     Effort: Pulmonary effort  is normal.     Breath sounds: Normal breath sounds.  Abdominal:     General: Abdomen is flat. Bowel sounds are normal.     Palpations: Abdomen is soft.     Tenderness: There is no abdominal tenderness. There is no guarding.  Musculoskeletal:        General: Normal range of motion.     Cervical back: Normal range of motion and neck supple.  Skin:    General: Skin is warm and dry.     Capillary Refill: Capillary refill takes less than 2 seconds.  Neurological:     General: No focal deficit present.     Mental Status: He is alert and oriented to person, place, and time.     Deep Tendon Reflexes: Reflexes normal.  Psychiatric:        Mood and Affect: Mood normal.        Behavior:  Behavior normal.    ED Results / Procedures / Treatments   Labs (all labs ordered are listed, but only abnormal results are displayed) Labs Reviewed - No data to display  EKG None  Radiology No results found.  Procedures Procedures   Medications Ordered in ED Medications  ibuprofen (ADVIL) tablet 200 mg (has no administration in time range)    ED Course  I have reviewed the triage vital signs and the nursing notes.  Pertinent labs & imaging results that were available during my care of the patient were reviewed by me and considered in my medical decision making (see chart for details).   B conjunctivitis.  USe eye drops as directed and follow up with your eyecare provider in 48 hours for recheck.    Kyle Lang was evaluated in Emergency Department on 01/28/2021 for the symptoms described in the history of present illness. He was evaluated in the context of the global COVID-19 pandemic, which necessitated consideration that the patient might be at risk for infection with the SARS-CoV-2 virus that causes COVID-19. Institutional protocols and algorithms that pertain to the evaluation of patients at risk for COVID-19 are in a state of rapid change based on information released by regulatory bodies including the CDC and federal and state organizations. These policies and algorithms were followed during the patient's care in the ED.  Final Clinical Impression(s) / ED Diagnoses Final diagnoses:  Acute conjunctivitis of both eyes, unspecified acute conjunctivitis type   Return for intractable cough, coughing up blood, fevers > 100.4 unrelieved by medication, shortness of breath, intractable vomiting, chest pain, shortness of breath, weakness, numbness, changes in speech, facial asymmetry, abdominal pain, passing out, Inability to tolerate liquids or food, cough, altered mental status or any concerns. No signs of systemic illness or infection. The patient is nontoxic-appearing on exam  and vital signs are within normal limits. I have reviewed the triage vital signs and the nursing notes. Pertinent labs & imaging results that were available during my care of the patient were reviewed by me and considered in my medical decision making (see chart for details). After history, exam, and medical workup I feel the patient has been appropriately medically screened and is safe for discharge home. Pertinent diagnoses were discussed with the patient. Patient was given return precautions.  Rx / DC Orders ED Discharge Orders          Ordered    ciprofloxacin (CILOXAN) 0.3 % ophthalmic solution  Every 4 hours while awake        01/28/21 0558  Cherysh Epperly, MD 01/28/21 1601
